# Patient Record
Sex: Male | Born: 1965 | Race: White | Hispanic: Yes | Marital: Married | Smoking: Former smoker
Health system: Southern US, Community
[De-identification: ages and names within clinical notes are randomized; demographics above are authoritative.]

## PROBLEM LIST (undated history)

## (undated) DIAGNOSIS — I10 Essential (primary) hypertension: Secondary | ICD-10-CM

## (undated) DIAGNOSIS — E7800 Pure hypercholesterolemia, unspecified: Secondary | ICD-10-CM

## (undated) HISTORY — PX: NO PAST SURGERIES: SHX2092

---

## 2009-08-15 ENCOUNTER — Ambulatory Visit: Payer: Self-pay

## 2011-04-20 IMAGING — CR DG RIBS 2V*R*
1 series · 2 of 2 positions shown · non-contrast
Comparison: none

REASON FOR EXAM: Chest Wall Trauma-FAX TO 3[REDACTED], Rickert Moemedi
COMMENTS:

RESULT:     Multiple views of the right ribs show no fracture, dislocation
or other acute bony abnormality.

[Series 1: view not recorded · 0.17mm/px · 2 of 2 slices shown]
[im 1/2]
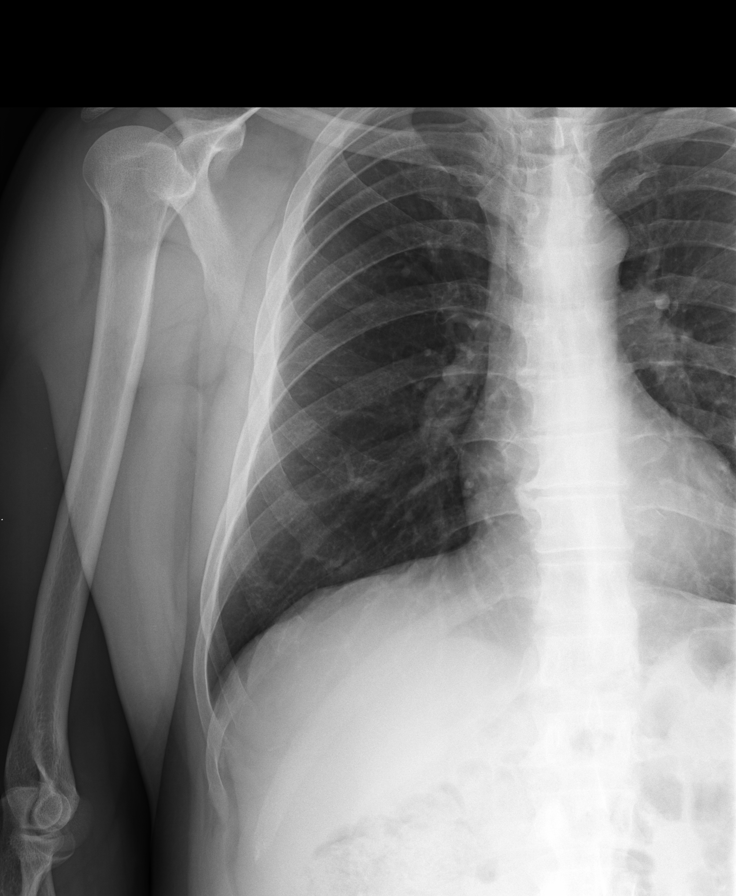
[im 2/2]
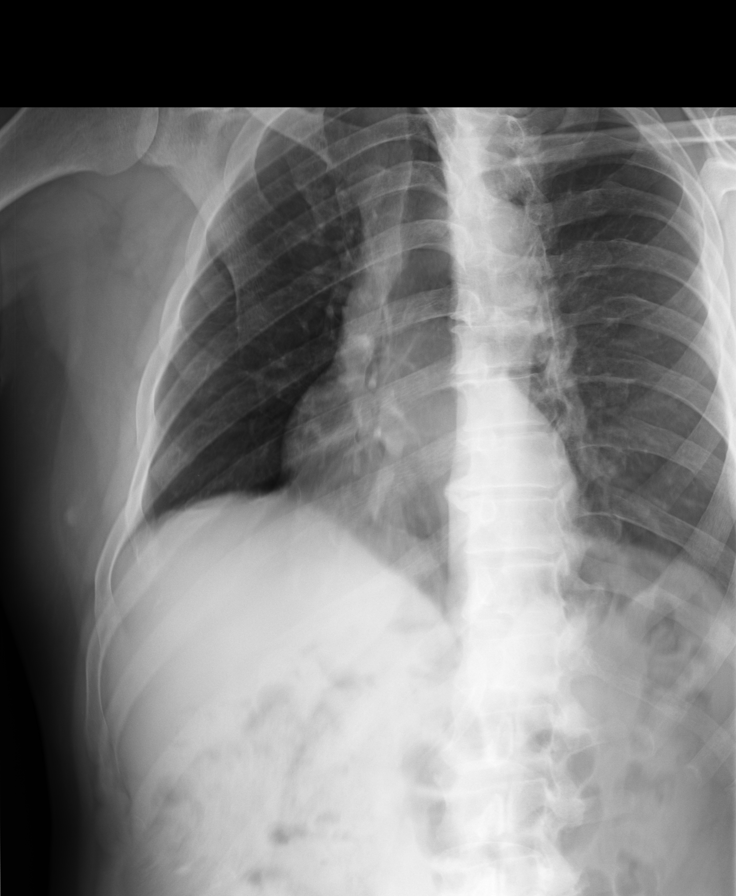

[2 of 2 positions shown; findings below may reference images not displayed]

IMPRESSION: 1. No acute bony abnormalities are identified.
2. No pneumothorax or pleural effusion is seen.

## 2017-04-27 ENCOUNTER — Telehealth: Payer: Self-pay

## 2017-04-27 ENCOUNTER — Other Ambulatory Visit: Payer: Self-pay

## 2017-04-27 DIAGNOSIS — Z1211 Encounter for screening for malignant neoplasm of colon: Secondary | ICD-10-CM

## 2017-04-27 NOTE — Telephone Encounter (Signed)
Gastroenterology Pre-Procedure Review  Request Date: 05/10/17 Requesting Physician: Dr. Allegra LaiVanga  PATIENT REVIEW QUESTIONS: The patient responded to the following health history questions as indicated:    1. Are you having any GI issues? no 2. Do you have a personal history of Polyps? no 3. Do you have a family history of Colon Cancer or Polyps? no 4. Diabetes Mellitus? no 5. Joint replacements in the past 12 months?no 6. Major health problems in the past 3 months?no 7. Any artificial heart valves, MVP, or defibrillator?no    MEDICATIONS & ALLERGIES:    Patient reports the following regarding taking any anticoagulation/antiplatelet therapy:   Plavix, Coumadin, Eliquis, Xarelto, Lovenox, Pradaxa, Brilinta, or Effient? no Aspirin? no  Patient confirms/reports the following medications:  No current outpatient medications on file.   No current facility-administered medications for this visit.     Patient confirms/reports the following allergies:  No Known Allergies  No orders of the defined types were placed in this encounter.   AUTHORIZATION INFORMATION Primary Insurance: 1D#: Group #:  Secondary Insurance: 1D#: Group #:  SCHEDULE INFORMATION: Date: 05/10/17 Time: Location:[facility]

## 2017-05-09 ENCOUNTER — Telehealth: Payer: Self-pay | Admitting: Gastroenterology

## 2017-05-09 NOTE — Telephone Encounter (Signed)
FINDA WITH THE [facility] CALLED & STATES THE [phone]#[id number] IS FOR PATIENT'S COLONOSCOPY SCHEDULED FOR 05-10-17 WITH DR Allegra LaiVANGA.

## 2017-05-10 ENCOUNTER — Ambulatory Visit
Admission: RE | Admit: 2017-05-10 | Discharge: 2017-05-10 | Disposition: A | Discharge status: Home / Self Care | Payer: Commercial Managed Care - PPO | Source: Ambulatory Visit | Attending: Gastroenterology | Admitting: Gastroenterology

## 2017-05-10 ENCOUNTER — Encounter
Admission: RE | Disposition: A | Discharge status: Home / Self Care | Payer: Self-pay | Source: Ambulatory Visit | Attending: Gastroenterology

## 2017-05-10 ENCOUNTER — Ambulatory Visit: Payer: Commercial Managed Care - PPO | Admitting: Anesthesiology

## 2017-05-10 DIAGNOSIS — E7800 Pure hypercholesterolemia, unspecified: Secondary | ICD-10-CM | POA: Diagnosis not present

## 2017-05-10 DIAGNOSIS — Z87891 Personal history of nicotine dependence: Secondary | ICD-10-CM | POA: Diagnosis not present

## 2017-05-10 DIAGNOSIS — Z1211 Encounter for screening for malignant neoplasm of colon: Secondary | ICD-10-CM | POA: Diagnosis present

## 2017-05-10 DIAGNOSIS — Z79899 Other long term (current) drug therapy: Secondary | ICD-10-CM | POA: Insufficient documentation

## 2017-05-10 DIAGNOSIS — I10 Essential (primary) hypertension: Secondary | ICD-10-CM | POA: Insufficient documentation

## 2017-05-10 DIAGNOSIS — K648 Other hemorrhoids: Secondary | ICD-10-CM | POA: Insufficient documentation

## 2017-05-10 HISTORY — DX: Pure hypercholesterolemia, unspecified: E78.00

## 2017-05-10 HISTORY — DX: Essential (primary) hypertension: I10

## 2017-05-10 HISTORY — PX: COLONOSCOPY WITH PROPOFOL: SHX5780

## 2017-05-10 SURGERY — COLONOSCOPY WITH PROPOFOL
Anesthesia: General

## 2017-05-10 MED ORDER — PROPOFOL 10 MG/ML IV BOLUS
INTRAVENOUS | Status: DC | PRN
  Administered 2017-05-10 (×3): 20 mg via INTRAVENOUS

## 2017-05-10 MED ORDER — LIDOCAINE HCL (CARDIAC) 20 MG/ML IV SOLN
INTRAVENOUS | Status: DC | PRN
  Administered 2017-05-10: 50 mg via INTRAVENOUS

## 2017-05-10 MED ORDER — SODIUM CHLORIDE 0.9 % IV SOLN
INTRAVENOUS | Status: DC
  Administered 2017-05-10 (×2): via INTRAVENOUS

## 2017-05-10 MED ORDER — PROPOFOL 500 MG/50ML IV EMUL
INTRAVENOUS | Status: AC
  Filled 2017-05-10: qty 50

## 2017-05-10 MED ORDER — PROPOFOL 500 MG/50ML IV EMUL
INTRAVENOUS | Status: DC | PRN
  Administered 2017-05-10: 150 ug/kg/min via INTRAVENOUS

## 2017-05-10 NOTE — Progress Notes (Signed)
Patient is alert and oriented. Dressed and waiting on his wife

## 2017-05-10 NOTE — Anesthesia Post-op Follow-up Note (Signed)
Anesthesia QCDR form completed.        

## 2017-05-10 NOTE — Discharge Instructions (Signed)
Colonoscopia en los adultos, cuidados posteriores  (Colonoscopy, Adult, Care After)  Aquí encontrará información sobre cómo cuidarse después del procedimiento. El médico también podrá darle instrucciones específicas. Si tiene problemas o preguntas, llame a su médico.  CUIDADOS EN EL HOGAR  Instrucciones generales  · Durante las primeras 24 horas después del procedimiento:  ? No conduzca ni opere maquinaria pesada.  ? No firme documentos importantes.  ? No beba alcohol.  ? Haga sus actividades diarias más lentamente que lo normal.  ? Coma alimentos que sean blandos y fáciles de digerir.  ? Descanse con frecuencia.  · Tome los medicamentos de venta libre o los recetados solamente como se lo haya indicado el médico.  · Depende de usted retirar los resultados del procedimiento. Pregúntele al médico o consulte en el departamento que realiza el procedimiento cuándo estarán los resultados.  Para aliviar los cólicos y el meteorismo:  · Intente caminar por la casa.  · Póngase calor en el vientre (abdomen) como se lo haya indicado el médico. Use la fuente de calor que el médico le recomiende, como una compresa de calor húmedo o una almohadilla térmica.  ? Coloque una toalla entre la piel y la fuente de calor.  ? Aplique el calor durante 20 a 30 minutos.  ? Retire la fuente de calor si la piel se le pone de color rojo brillante. Esto es muy importante si no puede sentir el dolor, el calor o el frío. Puede sufrir una quemadura.  Comida y bebida  · Beba suficiente líquido para mantener el pis (orina) claro o de color amarillo pálido.  · Reanude la dieta normal como se lo haya indicado el médico. Evite los alimentos pesados o fritos que son difíciles de digerir.  · No tome alcohol durante el tiempo que le haya indicado el médico.  SOLICITE AYUDA SI:  · Tiene sangre en la materia fecal (heces) 2 o 3 días después del procedimiento.    SOLICITE AYUDA DE INMEDIATO SI:  · Hay más que una pequeña cantidad de sangre en la materia  fecal.  · Observa grandes grumos de tejido (coágulos de sangre) en la materia fecal.  · Tiene el abdomen hinchado.  · Siente malestar estomacal (náuseas).  · Vomita.  · Tiene fiebre.  · Tiene dolor en el vientre que empeora y no se alivia con los medicamentos.    Esta información no tiene como fin reemplazar el consejo del médico. Asegúrese de hacerle al médico cualquier pregunta que tenga.  Document Released: 07/07/2010 Document Revised: 03/27/2013 Document Reviewed: 06/03/2015  Elsevier Interactive Patient Education © 2017 Elsevier Inc.

## 2017-05-10 NOTE — H&P (Signed)
  Arlyss Repressohini R Vanga, MD [address]  [address]  [place], [place] [address]  Main: [phone]  Fax: [phone] Pager: [phone]  Primary Care Physician:  [facility] Primary Gastroenterologist:  Dr. Arlyss Repressohini R Vanga  Pre-Procedure History & Physical: HPI:  Nathan Webster is a 52 y.o. male is here for an colonoscopy.   Past Medical History:  Diagnosis Date  . Hypercholesterolemia   . Hypertension     Past Surgical History:  Procedure Laterality Date  . NO PAST SURGERIES      Prior to Admission medications   Medication Sig Start Date End Date Taking? Authorizing Provider  atorvastatin (LIPITOR) 20 MG tablet Take 20 mg by mouth daily at 6 PM.   Yes [provider]    Allergies as of 04/27/2017  . (No Known Allergies)    History reviewed. No pertinent family history.  Social History   Socioeconomic History  . Marital status: Married    Spouse name: Not on file  . Number of children: Not on file  . Years of education: Not on file  . Highest education level: Not on file  Social Needs  . Financial resource strain: Not on file  . Food insecurity - worry: Not on file  . Food insecurity - inability: Not on file  . Transportation needs - medical: Not on file  . Transportation needs - non-medical: Not on file  Occupational History  . Not on file  Tobacco Use  . Smoking status: Former Smoker    Last attempt to quit: 1996    Years since quitting: 23.1  . Smokeless tobacco: Never Used  Substance and Sexual Activity  . Alcohol use: No    Frequency: Never  . Drug use: No  . Sexual activity: Not on file  Other Topics Concern  . Not on file  Social History Narrative  . Not on file    Review of Systems: See HPI, otherwise negative ROS  Physical Exam: BP (!) 132/92   Pulse (!) 59   Temp (!) 97.1 F (36.2 C) (Tympanic)   Resp 18   Ht 5\' 2"  (1.575 m)   Wt 175 lb (79.4 kg)   SpO2 100%   BMI 32.01 kg/m  General:    Alert,  pleasant and cooperative in NAD Head:  Normocephalic and atraumatic. Neck:  Supple; no masses or thyromegaly. Lungs:  Clear throughout to auscultation.    Heart:  Regular rate and rhythm. Abdomen:  Soft, nontender and nondistended. Normal bowel sounds, without guarding, and without rebound.   Neurologic:  Alert and  oriented x4;  grossly normal neurologically.  Impression/Plan: Nathan Webster is here for an colonoscopy to be performed for colon cancer screening  Risks, benefits, limitations, and alternatives regarding  colonoscopy have been reviewed with the patient.  Questions have been answered.  All parties agreeable.   Lannette Donathohini Vanga, MD  05/10/2017, 1:44 PM

## 2017-05-10 NOTE — Op Note (Signed)
[facility] Gastroenterology Patient Name: Nathan Webster Procedure Date: 05/10/2017 2:04 PM MRN: [id number] Account #: [id number] Date of Birth: [birth year] Admit Type: Outpatient Age: 52 Room: [facility] ENDO ROOM 4 Gender: Male Note Status: Finalized Procedure:            Colonoscopy Indications:          Screening for colorectal malignant neoplasm, This is                        the patient's first colonoscopy Providers:            Lin Landsman MD, MD Medicines:            Monitored Anesthesia Care Complications:        No immediate complications. Estimated blood loss: None. Procedure:            Pre-Anesthesia Assessment:                       - Prior to the procedure, a History and Physical was                        performed, and patient medications and allergies were                        reviewed. The patient is competent. The risks and                        benefits of the procedure and the sedation options and                        risks were discussed with the patient. All questions                        were answered and informed consent was obtained.                        Patient identification and proposed procedure were                        verified by the physician, the nurse, the                        anesthesiologist, the anesthetist and the technician in                        the pre-procedure area in the procedure room. Mental                        Status Examination: alert and oriented. Airway                        Examination: normal oropharyngeal airway and neck                        mobility. Respiratory Examination: clear to                        auscultation. CV Examination: normal. Prophylactic  Antibiotics: The patient does not require prophylactic                        antibiotics. Prior Anticoagulants: The patient has                        taken no previous anticoagulant or antiplatelet  agents.                        ASA Grade Assessment: II - A patient with mild systemic                        disease. After reviewing the risks and benefits, the                        patient was deemed in satisfactory condition to undergo                        the procedure. The anesthesia plan was to use monitored                        anesthesia care (MAC). Immediately prior to                        administration of medications, the patient was                        re-assessed for adequacy to receive sedatives. The                        heart rate, respiratory rate, oxygen saturations, blood                        pressure, adequacy of pulmonary ventilation, and                        response to care were monitored throughout the                        procedure. The physical status of the patient was                        re-assessed after the procedure.                       After obtaining informed consent, the colonoscope was                        passed under direct vision. Throughout the procedure,                        the patient's blood pressure, pulse, and oxygen                        saturations were monitored continuously. The                        Colonoscope was introduced through the anus and                        advanced  to the the terminal ileum. The colonoscopy was                        performed without difficulty. The patient tolerated the                        procedure well. The quality of the bowel preparation                        was evaluated using the BBPS [facility] Bowel Preparation                        Scale) with scores of: Right Colon = 3, Transverse                        Colon = 3 and Left Colon = 3 (entire mucosa seen well                        with no residual staining, small fragments of stool or                        opaque liquid). The total BBPS score equals 9. Findings:      The perianal and digital rectal examinations were  normal. Pertinent       negatives include normal sphincter tone and no palpable rectal lesions.      The terminal ileum appeared normal.      Non-bleeding internal hemorrhoids were found during retroflexion. The       hemorrhoids were large.      The entire examined colon appeared normal. Impression:           - The examined portion of the ileum was normal.                       - Non-bleeding internal hemorrhoids.                       - The entire examined colon is normal.                       - No specimens collected. Recommendation:       - Discharge patient to home.                       - Resume regular diet today.                       - Continue present medications.                       - Repeat colonoscopy in 10 years for surveillance. Procedure Code(s):    --- Professional ---                       K3491, Colorectal cancer screening; colonoscopy on                        individual not meeting criteria for high risk Diagnosis Code(s):    --- Professional ---                       Z12.11, Encounter for  screening for malignant neoplasm                        of colon                       K64.8, Other hemorrhoids CPT copyright 2016 American Medical Association. All rights reserved. The codes documented in this report are preliminary and upon coder review may  be revised to meet current compliance requirements. Dr. Ulyess Mort Lin Landsman MD, MD 05/10/2017 2:40:03 PM This report has been signed electronically. Number of Addenda: 0 Note Initiated On: 05/10/2017 2:04 PM Scope Withdrawal Time: 0 hours 8 minutes 9 seconds  Total Procedure Duration: 0 hours 10 minutes 34 seconds       [facility]

## 2017-05-10 NOTE — Transfer of Care (Signed)
Immediate Anesthesia Transfer of Care Note  Patient: Nathan Webster  Procedure(s) Performed: COLONOSCOPY WITH PROPOFOL (N/A )  Patient Location: PACU  Anesthesia Type:MAC  Level of Consciousness: awake  Airway & Oxygen Therapy: Patient Spontanous Breathing  Post-op Assessment: Report given to RN  Post vital signs: stable  Last Vitals:  Vitals:   05/10/17 1216  BP: (!) 132/92  Pulse: (!) 59  Resp: 18  Temp: (!) 36.2 C  SpO2: 100%    Last Pain:  Vitals:   05/10/17 1216  TempSrc: Tympanic         Complications: No apparent anesthesia complications

## 2017-05-10 NOTE — Anesthesia Preprocedure Evaluation (Signed)
Anesthesia Evaluation  Patient identified by MRN, date of birth, ID band Patient awake    Reviewed: Allergy & Precautions, NPO status , Patient's Chart, lab work & pertinent test results  History of Anesthesia Complications Negative for: history of anesthetic complications  Airway Mallampati: II  TM Distance: >3 FB Neck ROM: Full    Dental no notable dental hx.    Pulmonary neg sleep apnea, neg COPD, former smoker,    breath sounds clear to auscultation- rhonchi (-) wheezing      Cardiovascular Exercise Tolerance: Good hypertension, Pt. on medications (-) CAD, (-) Past MI, (-) Cardiac Stents and (-) CABG  Rhythm:Regular Rate:Normal - Systolic murmurs and - Diastolic murmurs    Neuro/Psych negative neurological ROS  negative psych ROS   GI/Hepatic negative GI ROS, Neg liver ROS,   Endo/Other  negative endocrine ROSneg diabetes  Renal/GU negative Renal ROS     Musculoskeletal negative musculoskeletal ROS (+)   Abdominal (+) + obese,   Peds  Hematology negative hematology ROS (+)   Anesthesia Other Findings Past Medical History: No date: Hypercholesterolemia No date: Hypertension   Reproductive/Obstetrics                             Anesthesia Physical Anesthesia Plan  ASA: II  Anesthesia Plan: General   Post-op Pain Management:    Induction: Intravenous  PONV Risk Score and Plan: 1 and Propofol infusion  Airway Management Planned: Natural Airway  Additional Equipment:   Intra-op Plan:   Post-operative Plan:   Informed Consent: I have reviewed the patients History and Physical, chart, labs and discussed the procedure including the risks, benefits and alternatives for the proposed anesthesia with the patient or authorized representative who has indicated his/her understanding and acceptance.   Dental advisory given  Plan Discussed with: CRNA and  Anesthesiologist  Anesthesia Plan Comments:         Anesthesia Quick Evaluation

## 2017-05-10 NOTE — OR Nursing (Addendum)
Orson SlickJacqui Laukaitis here to be our spanish interpreter.  Pt reports he takes a medication for high blood pressure and cholesterol, but does not know their names.

## 2017-05-11 ENCOUNTER — Encounter: Payer: Self-pay | Admitting: Gastroenterology

## 2017-05-11 NOTE — Anesthesia Postprocedure Evaluation (Signed)
Anesthesia Post Note  Patient: Nathan Webster  Procedure(s) Performed: COLONOSCOPY WITH PROPOFOL (N/A )  Patient location during evaluation: Endoscopy Anesthesia Type: General Level of consciousness: awake and alert and oriented Pain management: pain level controlled Vital Signs Assessment: post-procedure vital signs reviewed and stable Respiratory status: spontaneous breathing, nonlabored ventilation and respiratory function stable Cardiovascular status: blood pressure returned to baseline and stable Postop Assessment: no signs of nausea or vomiting Anesthetic complications: no     Last Vitals:  Vitals:   05/10/17 1451 05/10/17 1501  BP: (!) 129/95 (!) 135/91  Pulse: 65 (!) 55  Resp: 20 19  Temp:    SpO2: 100% 100%    Last Pain:  Vitals:   05/11/17 0820  TempSrc:   PainSc: 0-No pain                 Koji Niehoff

## 2018-05-11 DIAGNOSIS — N390 Urinary tract infection, site not specified: Secondary | ICD-10-CM | POA: Diagnosis not present

## 2018-05-11 DIAGNOSIS — R52 Pain, unspecified: Secondary | ICD-10-CM | POA: Diagnosis not present

## 2018-05-11 DIAGNOSIS — R350 Frequency of micturition: Secondary | ICD-10-CM | POA: Diagnosis not present

## 2018-10-24 ENCOUNTER — Other Ambulatory Visit: Payer: Self-pay

## 2018-10-24 DIAGNOSIS — Z20822 Contact with and (suspected) exposure to covid-19: Secondary | ICD-10-CM

## 2018-10-26 LAB — NOVEL CORONAVIRUS, NAA: SARS-CoV-2, NAA: NOT DETECTED

## 2018-10-30 NOTE — Progress Notes (Signed)
Patient came to [facility] testing site.  Negative covid results given to patient.

## 2018-11-14 ENCOUNTER — Observation Stay
Admission: EM | Admit: 2018-11-14 | Discharge: 2018-11-15 | Disposition: A | Discharge status: Home / Self Care | Payer: Commercial Managed Care - PPO | Attending: Internal Medicine | Admitting: Internal Medicine

## 2018-11-14 ENCOUNTER — Inpatient Hospital Stay: Payer: Commercial Managed Care - PPO

## 2018-11-14 ENCOUNTER — Other Ambulatory Visit: Payer: Self-pay

## 2018-11-14 ENCOUNTER — Encounter: Payer: Self-pay | Admitting: Emergency Medicine

## 2018-11-14 DIAGNOSIS — E860 Dehydration: Secondary | ICD-10-CM | POA: Insufficient documentation

## 2018-11-14 DIAGNOSIS — Z87891 Personal history of nicotine dependence: Secondary | ICD-10-CM | POA: Insufficient documentation

## 2018-11-14 DIAGNOSIS — Z79899 Other long term (current) drug therapy: Secondary | ICD-10-CM | POA: Diagnosis not present

## 2018-11-14 DIAGNOSIS — R7989 Other specified abnormal findings of blood chemistry: Secondary | ICD-10-CM | POA: Diagnosis present

## 2018-11-14 DIAGNOSIS — T675XXA Heat exhaustion, unspecified, initial encounter: Secondary | ICD-10-CM | POA: Diagnosis present

## 2018-11-14 DIAGNOSIS — R739 Hyperglycemia, unspecified: Secondary | ICD-10-CM | POA: Diagnosis not present

## 2018-11-14 DIAGNOSIS — I119 Hypertensive heart disease without heart failure: Secondary | ICD-10-CM | POA: Insufficient documentation

## 2018-11-14 DIAGNOSIS — I249 Acute ischemic heart disease, unspecified: Secondary | ICD-10-CM | POA: Diagnosis present

## 2018-11-14 DIAGNOSIS — Z1159 Encounter for screening for other viral diseases: Secondary | ICD-10-CM | POA: Insufficient documentation

## 2018-11-14 DIAGNOSIS — R079 Chest pain, unspecified: Secondary | ICD-10-CM

## 2018-11-14 DIAGNOSIS — Z7982 Long term (current) use of aspirin: Secondary | ICD-10-CM | POA: Insufficient documentation

## 2018-11-14 DIAGNOSIS — E876 Hypokalemia: Secondary | ICD-10-CM | POA: Diagnosis not present

## 2018-11-14 DIAGNOSIS — R0789 Other chest pain: Principal | ICD-10-CM | POA: Insufficient documentation

## 2018-11-14 DIAGNOSIS — E785 Hyperlipidemia, unspecified: Secondary | ICD-10-CM | POA: Insufficient documentation

## 2018-11-14 DIAGNOSIS — R778 Other specified abnormalities of plasma proteins: Secondary | ICD-10-CM

## 2018-11-14 LAB — HEMOGLOBIN A1C
Hgb A1c MFr Bld: 5.6 % (ref 4.8–5.6)
Mean Plasma Glucose: 114.02 mg/dL

## 2018-11-14 LAB — LIPID PANEL
Cholesterol: 204 mg/dL — ABNORMAL HIGH (ref 0–200)
HDL: 41 mg/dL (ref >40)
LDL Cholesterol: 125 mg/dL — ABNORMAL HIGH (ref 0–99)
Total CHOL/HDL Ratio: 5 RATIO
Triglycerides: 192 mg/dL — ABNORMAL HIGH (ref <150)
VLDL: 38 mg/dL (ref 0–40)

## 2018-11-14 LAB — BASIC METABOLIC PANEL
Anion gap: 9 (ref 5–15)
BUN: 18 mg/dL (ref 6–20)
CO2: 23 mmol/L (ref 22–32)
Calcium: 8.6 mg/dL — ABNORMAL LOW (ref 8.9–10.3)
Chloride: 102 mmol/L (ref 98–111)
Creatinine, Ser: 1.16 mg/dL (ref 0.61–1.24)
GFR calc Af Amer: >60 mL/min (ref >60)
GFR calc non Af Amer: >60 mL/min (ref >60)
Glucose, Bld: 165 mg/dL — ABNORMAL HIGH (ref 70–99)
Potassium: 3.4 mmol/L — ABNORMAL LOW (ref 3.5–5.1)
Sodium: 134 mmol/L — ABNORMAL LOW (ref 135–145)

## 2018-11-14 LAB — URINALYSIS, COMPLETE (UACMP) WITH MICROSCOPIC
Bacteria, UA: NONE SEEN
Bilirubin Urine: NEGATIVE
Glucose, UA: NEGATIVE mg/dL
Hgb urine dipstick: NEGATIVE
Ketones, ur: NEGATIVE mg/dL
Leukocytes,Ua: NEGATIVE
Nitrite: NEGATIVE
Protein, ur: NEGATIVE mg/dL
Specific Gravity, Urine: 1.008 (ref 1.005–1.030)
Squamous Epithelial / LPF: NONE SEEN (ref 0–5)
WBC, UA: NONE SEEN WBC/hpf (ref 0–5)
pH: 5 (ref 5.0–8.0)

## 2018-11-14 LAB — TROPONIN I (HIGH SENSITIVITY)
Troponin I (High Sensitivity): 76 ng/L — ABNORMAL HIGH (ref <18)
Troponin I (High Sensitivity): 93 ng/L — ABNORMAL HIGH (ref <18)

## 2018-11-14 LAB — SARS CORONAVIRUS 2 BY RT PCR (HOSPITAL ORDER, PERFORMED IN ~~LOC~~ HOSPITAL LAB): SARS Coronavirus 2: NEGATIVE

## 2018-11-14 LAB — CBC
HCT: 41.9 % (ref 39.0–52.0)
Hemoglobin: 13.7 g/dL (ref 13.0–17.0)
MCH: 29.2 pg (ref 26.0–34.0)
MCHC: 32.7 g/dL (ref 30.0–36.0)
MCV: 89.3 fL (ref 80.0–100.0)
Platelets: 221 10*3/uL (ref 150–400)
RBC: 4.69 MIL/uL (ref 4.22–5.81)
RDW: 12.6 % (ref 11.5–15.5)
WBC: 8 10*3/uL (ref 4.0–10.5)
nRBC: 0 % (ref 0.0–0.2)

## 2018-11-14 LAB — TSH: TSH: 0.978 u[IU]/mL (ref 0.350–4.500)

## 2018-11-14 LAB — CK: Total CK: 89 U/L (ref 49–397)

## 2018-11-14 MED ORDER — ATORVASTATIN CALCIUM 20 MG PO TABS
20.0000 mg | ORAL_TABLET | Freq: Every day | ORAL | Status: DC
  Administered 2018-11-14 – 2018-11-15 (×2): 20 mg via ORAL
  Filled 2018-11-14 (×2): qty 1

## 2018-11-14 MED ORDER — ASPIRIN EC 81 MG PO TBEC
81.0000 mg | DELAYED_RELEASE_TABLET | Freq: Every day | ORAL | Status: DC
  Administered 2018-11-15: 81 mg via ORAL
  Filled 2018-11-14 (×2): qty 1

## 2018-11-14 MED ORDER — ENOXAPARIN SODIUM 100 MG/ML ~~LOC~~ SOLN
1.0000 mg/kg | Freq: Two times a day (BID) | SUBCUTANEOUS | Status: DC
  Administered 2018-11-14 – 2018-11-15 (×2): 85 mg via SUBCUTANEOUS
  Filled 2018-11-14 (×3): qty 1

## 2018-11-14 MED ORDER — ASPIRIN 81 MG PO CHEW
324.0000 mg | CHEWABLE_TABLET | Freq: Once | ORAL | Status: AC
  Administered 2018-11-14: 324 mg via ORAL
  Filled 2018-11-14: qty 4

## 2018-11-14 MED ORDER — NITROGLYCERIN 0.4 MG SL SUBL: 0.4000 mg | SUBLINGUAL_TABLET | SUBLINGUAL | Status: DC | PRN

## 2018-11-14 MED ORDER — VITAMIN B-1 100 MG PO TABS
250.0000 mg | ORAL_TABLET | Freq: Every day | ORAL | Status: DC
  Administered 2018-11-14 – 2018-11-15 (×2): 250 mg via ORAL
  Filled 2018-11-14 (×2): qty 1

## 2018-11-14 MED ORDER — POTASSIUM CHLORIDE CRYS ER 20 MEQ PO TBCR
40.0000 meq | EXTENDED_RELEASE_TABLET | Freq: Once | ORAL | Status: AC
  Administered 2018-11-14: 40 meq via ORAL
  Filled 2018-11-14: qty 2

## 2018-11-14 MED ORDER — HYDROCHLOROTHIAZIDE 12.5 MG PO CAPS
12.5000 mg | ORAL_CAPSULE | Freq: Every day | ORAL | Status: DC
  Administered 2018-11-14 – 2018-11-15 (×2): 12.5 mg via ORAL
  Filled 2018-11-14 (×2): qty 1

## 2018-11-14 MED ORDER — SODIUM CHLORIDE 0.9 % IV BOLUS
1000.0000 mL | Freq: Once | INTRAVENOUS | Status: AC
  Administered 2018-11-14: 1000 mL via INTRAVENOUS

## 2018-11-14 MED ORDER — ONDANSETRON HCL 4 MG/2ML IJ SOLN: 4.0000 mg | Freq: Four times a day (QID) | INTRAMUSCULAR | Status: DC | PRN

## 2018-11-14 MED ORDER — ACETAMINOPHEN 325 MG PO TABS: 650.0000 mg | ORAL_TABLET | ORAL | Status: DC | PRN

## 2018-11-14 MED ORDER — LISINOPRIL 10 MG PO TABS
10.0000 mg | ORAL_TABLET | Freq: Every day | ORAL | Status: DC
  Administered 2018-11-14 – 2018-11-15 (×2): 10 mg via ORAL
  Filled 2018-11-14 (×3): qty 1

## 2018-11-14 MED ORDER — LISINOPRIL-HYDROCHLOROTHIAZIDE 10-12.5 MG PO TABS: 1.0000 | ORAL_TABLET | Freq: Every day | ORAL | Status: DC

## 2018-11-14 MED ORDER — VITAMIN C 500 MG PO TABS
250.0000 mg | ORAL_TABLET | Freq: Every day | ORAL | Status: DC
  Administered 2018-11-14 – 2018-11-15 (×2): 250 mg via ORAL
  Filled 2018-11-14 (×2): qty 1

## 2018-11-14 NOTE — H&P (Addendum)
[facility] [place] [facility]   PATIENT NAME: Nathan Webster    MR#:  [id number]  DATE OF BIRTH:  08-20-1965  DATE OF ADMISSION:  11/14/2018  PRIMARY CARE PHYSICIAN: [facility]   REQUESTING/REFERRING PHYSICIAN: Bayard MalesBrown, [place], MD  CHIEF COMPLAINT:   Chief Complaint  Patient presents with  . Nausea  . Generalized Body Aches  . Weakness    HISTORY OF PRESENT ILLNESS:   53 year old Spanish speaking male with past medical history of hypertension and hyperlipidemia presenting to the ED with chief complaints of chest pain, nausea and generalized body aches.  Patient report onset of symptoms since yesterday around 10:50 AM in the morning.  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 Chest pain lasted approximately two hours. He also endorses thoracic back pain with radiation into upper extremities. He was concerned about possible dehydration and increased fluid intake of which following noted increased urinary frequency. Patient state he has had chest palpitation for about a month with worsening symptoms in the last 15 days.  Per patient's daughter who is currently at the bedside, patient has had symptoms of  "impending doom" since he lost his father on July 27/2020.  Denies fevers , shortness of breath, vomiting, diarrhea, or syncope.  Patient states his coworker recently tested positive for COVID.  Due to persistent fatigue and dizziness he presented to his pcp for evaluation who sent him the ED.  On arrival to the ED, he was afebrile with blood pressure 128/8081mm Hg and pulse rate 66 beats/min. There were no focal neurological deficits; he was alert and oriented x4, and he did not demonstrate any memory deficits.  Initial blood revealed sodium 134, potassium 3.4, glucose 164 with  unremarkable CBC,Troponins elevated at 76, CK normal.  Given elevated troponin levels hospitalist was contacted to admit for further evaluation and management.  PAST MEDICAL HISTORY:   Past Medical History:  Diagnosis Date  . Hypercholesterolemia   . Hypertension     PAST SURGICAL HISTORY:   Past Surgical History:  Procedure Laterality Date  . COLONOSCOPY WITH PROPOFOL N/A 05/10/2017   Procedure: COLONOSCOPY WITH PROPOFOL;  Surgeon: Toney ReilVanga, Rohini Reddy, MD;  [facility];  Service: Gastroenterology;  Laterality: N/A;  . NO PAST SURGERIES      SOCIAL HISTORY:   Social History   Tobacco Use  . Smoking status: Former Smoker    Quit date: 1996    Years since quitting: 24.6  . Smokeless tobacco: Never Used  Substance Use Topics  . Alcohol use: No    Frequency: Never    FAMILY HISTORY:  No family history on file.  DRUG ALLERGIES:  No Known Allergies  REVIEW OF SYSTEMS:   Review of Systems  Constitutional: Positive for chills and malaise/fatigue. Negative for fever and weight loss.  HENT: Negative for congestion, hearing loss and sore throat.   Eyes: Negative for blurred vision and double vision.  Respiratory: Negative for cough, shortness of breath and wheezing.   Cardiovascular: Positive for chest pain and palpitations. Negative for orthopnea and leg swelling.  Gastrointestinal: Positive for nausea. Negative for abdominal pain, diarrhea and vomiting.  Genitourinary: Negative for dysuria and urgency.  Musculoskeletal: Positive for myalgias.  Skin: Negative for rash.  Neurological: Positive for dizziness and weakness. Negative for sensory change, speech change, focal weakness and headaches.  Psychiatric/Behavioral: Negative for  depression.    MEDICATIONS AT HOME:   Prior to Admission medications   Medication Sig Start Date End Date Taking? Authorizing Provider  atorvastatin (LIPITOR) 20 MG tablet Take 20 mg by mouth daily at 6 PM.   Yes [provider]  lisinopril-hydrochlorothiazide (ZESTORETIC) 10-12.5 MG tablet Take 1 tablet by mouth daily. for high blood pressure 10/25/18  Yes [provider]  Thiamine HCl (VITAMIN B-1) 250 MG tablet Take 250 mg by mouth daily.   Yes [provider]  vitamin C (ASCORBIC ACID) 250 MG tablet Take 250 mg by mouth daily.   Yes [provider]      VITAL SIGNS:  Blood pressure (!) 137/92, pulse 61, temperature 98.3 F (36.8 C), temperature source Oral, resp. rate 16, height 5\' 5"  (1.651 m), weight 82.6 kg, SpO2 100 %.  PHYSICAL EXAMINATION:   Physical Exam  GENERAL:  53 y.o.-year-old patient lying in the bed with no acute distress.  EYES: Pupils equal, round, reactive to light and accommodation. No scleral icterus. Extraocular muscles intact.  HEENT: Head atraumatic, normocephalic. Oropharynx and nasopharynx clear.  NECK:  Supple, no jugular venous distention. No thyroid enlargement, no tenderness.  LUNGS: Normal breath sounds bilaterally, no wheezing, rales,rhonchi or crepitation. No use of accessory muscles of respiration.  CARDIOVASCULAR: S1, S2 normal. No murmurs, rubs, or gallops.  ABDOMEN: Soft, nontender, nondistended. Bowel sounds present. No organomegaly or mass.  EXTREMITIES: No pedal edema, cyanosis, or clubbing. No rash or lesions. + pedal pulses MUSCULOSKELETAL: Normal bulk, and power was 5+ grip and elbow, knee, and ankle flexion and extension bilaterally.  NEUROLOGIC:Alert and oriented x 3. CN 2-12 intact. Sensation to light touch and cold stimuli intact bilaterally. DTR's (biceps, patellar, and achilles) 2+ and symmetric throughout. Gait not tested due to safety concern. PSYCHIATRIC: The patient is alert and oriented x 3.  SKIN: No obvious rash, lesion, or ulcer.   DATA REVIEWED:  LABORATORY PANEL:   CBC Recent Labs  Lab 11/14/18 1337  WBC 8.0  HGB 13.7  HCT 41.9  PLT 221    ------------------------------------------------------------------------------------------------------------------  Chemistries  Recent Labs  Lab 11/14/18 1337  NA 134*  K 3.4*  CL 102  CO2 23  GLUCOSE 165*  BUN 18  CREATININE 1.16  CALCIUM 8.6*   ------------------------------------------------------------------------------------------------------------------  Cardiac Enzymes No results for input(s): TROPONINI in the last 168 hours. ------------------------------------------------------------------------------------------------------------------  RADIOLOGY:  No results found.  EKG:  EKG: normal EKG, normal sinus rhythm, unchanged from previous tracings.  IMPRESSION AND PLAN:   53 y.o. male with past medical history of hypertension and hyperlipidemia presenting to the ED with chief complaints of chest pain, nausea, and generalized weakness after working in the hot sun for extended period of time.  1. Elevated troponin concerning for NSTEMI ( no evidence of dynamic EKG changes with abnormal cardiac enzymes). Heat exhaustion, anxiety in the differentials as patient recently lost father and not coping well. - Admit to telemetry unit - Chest xray - Will obtain Echocardiogram - ASA 81mg  PO daily (initial dose given in the ED) - Start  Metoprolol 12.5mg  PO bid (titrate to goal HR<70)  - NTG + morphine PRN chest pain or NTG drip if ongoing chest pain  - Anti-coagulation with Lovenox per ACS protocol - Trend troponin - Cardiology consult  2. Hypokalemia - replete and recheck in the am +mag  3. HTN  + Goal BP <130/80 - Start Metoprolol low dose as above - Continue Lisonopril-hydrochlorothiazide  4. # HLD  +  Goal LDL<100 - Check lipid panel - Atorvastatin 20mg  PO qhs  5. Elevated blood glucose - No hx of Diabetes - Check HgbA1c - SSI if indicated  6. DVT prophylaxis - Therapeutically anti-coagulated with warfarin enoxaparin    All the records are reviewed and case  discussed with ED provider. Management plans discussed with the patient, family and they are in agreement.  CODE STATUS: FULL  TOTAL TIME TAKING CARE OF THIS PATIENT: 50 minutes.    on 11/14/2018 at 6:00 PM  Webb SilversmithElizabeth Alura Olveda, DNP, FNP-BC [facility] Nurse Practitioner Between 7am to 6pm - Pager [phone]- [phone]  After 6pm go to [email or web address] - Social research officer, governmentpassword [email or web address] [email or web address]  [facility]  Office  [phone]  CC: Primary care physician; [facility]

## 2018-11-14 NOTE — Progress Notes (Signed)
Family Meeting Note  Advance Directive:yes  Today a meeting took place with the Patient and daughter.   The following clinical team members were present during this meeting:MD  The following were discussed:Patient's diagnosis: ACS, hypokalemia, Hyperlipidemia, Patient's progosis: Unable to determine and Goals for treatment: Full Code  Additional follow-up to be provided: Cardiology  Time spent during discussion:20 minutes  Vaughan Basta, MD

## 2018-11-14 NOTE — ED Provider Notes (Addendum)
[facility] Emergency Department Provider Note   First MD Initiated Contact with Patient 11/14/18 1407     (approximate)  I have reviewed the triage vital signs and the nursing notes.   HISTORY  Chief Complaint Nausea, Generalized Body Aches, and Weakness    HPI Nathan Webster is a 53 y.o. male with history of hypertension and hypercholesteremia presents to the emergency department secondary to generalized body aches nausea which patient states began yesterday and continued into today.  Patient states that he worked outside yesterday and was very hot and sweating profusely while outdoors.  Patient states that coworker of his recently tested positive for COVID-19.  Patient denies any cough no shortness of breath.        Past Medical History:  Diagnosis Date  . Hypercholesterolemia   . Hypertension     There are no active problems to display for this patient.   Past Surgical History:  Procedure Laterality Date  . COLONOSCOPY WITH PROPOFOL N/A 05/10/2017   Procedure: COLONOSCOPY WITH PROPOFOL;  Surgeon: Lin Landsman, MD;  Location: [facility] ENDOSCOPY;  Service: Gastroenterology;  Laterality: N/A;  . NO PAST SURGERIES      Prior to Admission medications   Medication Sig Start Date End Date Taking? Authorizing Provider  atorvastatin (LIPITOR) 20 MG tablet Take 20 mg by mouth daily at 6 PM.    [provider]    Allergies Patient has no known allergies.  No family history on file.  Social History Social History   Tobacco Use  . Smoking status: Former Smoker    Quit date: 1996    Years since quitting: 24.6  . Smokeless tobacco: Never Used  Substance Use Topics  . Alcohol use: No    Frequency: Never  . Drug use: No    Review of Systems Constitutional: No fever/chills Eyes: No visual changes. ENT: No sore throat. Cardiovascular: Positive for chest pain. Respiratory: Denies shortness of breath. Gastrointestinal: No  abdominal pain.  No nausea, no vomiting.  No diarrhea.  No constipation. Genitourinary: Negative for dysuria. Musculoskeletal:  Positive for generalized muscle aches Integumentary: Negative for rash. Neurological: Negative for headaches, focal weakness or numbness.  ____________________________________________   PHYSICAL EXAM:  VITAL SIGNS: ED Triage Vitals  Enc Vitals Group     BP 11/14/18 1325 128/81     Pulse Rate 11/14/18 1325 66     Resp 11/14/18 1325 16     Temp 11/14/18 1325 98.3 F (36.8 C)     Temp Source 11/14/18 1325 Oral     SpO2 11/14/18 1325 98 %     Weight 11/14/18 1326 82.6 kg (182 lb)     Height 11/14/18 1326 1.651 m (5\' 5" )     Head Circumference --      Peak Flow --      Pain Score 11/14/18 1326 6     Pain Loc --      Pain Edu? --      Excl. in [place]? --     Constitutional: Alert and oriented.  Eyes: Conjunctivae are normal.  Mouth/Throat: Mucous membranes are moist. Neck: No stridor.  No meningeal signs.   Cardiovascular: Normal rate, regular rhythm. Good peripheral circulation. Grossly normal heart sounds. Respiratory: Normal respiratory effort.  No retractions. Gastrointestinal: Soft and nontender. No distention.  Musculoskeletal: No lower extremity tenderness nor edema. No gross deformities of extremities. Neurologic:  Normal speech and language. No gross focal neurologic deficits are appreciated.  Skin:  Skin  is warm, dry and intact. Psychiatric: Mood and affect are normal. Speech and behavior are normal.  ____________________________________________   LABS (all labs ordered are listed, but only abnormal results are displayed)  Labs Reviewed  BASIC METABOLIC PANEL - Abnormal; Notable for the following components:      Result Value   Sodium 134 (*)    Potassium 3.4 (*)    Glucose, Bld 165 (*)    Calcium 8.6 (*)    All other components within normal limits  SARS CORONAVIRUS 2 (HOSPITAL ORDER, PERFORMED IN [facility])  CBC  CK   URINALYSIS, COMPLETE (UACMP) WITH MICROSCOPIC   ____________________________________________  EKG ED ECG REPORT I, [place] N Purvis Sidle, the attending physician, personally viewed and interpreted this ECG.   Date: 11/14/2018  EKG Time: 1:33 PM  Rate: 65  Rhythm: Normal sinus rhythm  Axis: Normal  Intervals: Normal  ST&T Change: None   Procedures   ____________________________________________   INITIAL IMPRESSION / MDM / ASSESSMENT AND PLAN / ED COURSE  As part of my medical decision making, I reviewed the following data within the electronic MEDICAL RECORD NUMBER   53 year old male presenting with above-stated history and physical exam concerning for possible heat exhaustion.  However did consider the possibility of ACS and as such EKG was performed which revealed no evidence of ischemia or infarction however patient's troponin elevated at 76.   Patient given 2 L IV normal saline and states that that he feels much better at this time.  In addition also considered possibly of COVID-19 and as such testing was performed which was negative.  Patient given aspirin in the emergency department secondary to elevated troponin with concern for end STEMI.  Plan to admit the patient to hospitalist.  Patient discussed with Dr. Elisabeth PigeonVachhani for hospital admission for further evaluation and management.     ____________________________________________  FINAL CLINICAL IMPRESSION(S) / ED DIAGNOSES  Final diagnoses:  Heat exhaustion, initial encounter  Elevated troponin     MEDICATIONS GIVEN DURING THIS VISIT:  Medications  sodium chloride 0.9 % bolus 1,000 mL (1,000 mLs Intravenous New Bag/Given 11/14/18 1423)  sodium chloride 0.9 % bolus 1,000 mL (1,000 mLs Intravenous New Bag/Given 11/14/18 1423)     ED Discharge Orders    None      *Please note:  Laddie AquasBernardino Cruz Segura was evaluated in Emergency Department on 11/14/2018 for the symptoms described in the history of present illness. He was  evaluated in the context of the global COVID-19 pandemic, which necessitated consideration that the patient might be at risk for infection with the SARS-CoV-2 virus that causes COVID-19. Institutional protocols and algorithms that pertain to the evaluation of patients at risk for COVID-19 are in a state of rapid change based on information released by regulatory bodies including the CDC and federal and state organizations. These policies and algorithms were followed during the patient's care in the ED.  Some ED evaluations and interventions may be delayed as a result of limited staffing during the pandemic.*  Note:  This document was prepared using Dragon voice recognition software and may include unintentional dictation errors.   Darci CurrentBrown, [place] N, MD 11/14/18 1548    Darci CurrentBrown, [place] N, MD 11/14/18 1744

## 2018-11-14 NOTE — ED Triage Notes (Signed)
Patient reports yesterday while working outside, he developed chest pain, nausea and generalized body aches. Reports he was really hot and sweating. Today patient is complaining of continued body aches and fatigue.

## 2018-11-14 NOTE — ED Notes (Signed)
ED TO INPATIENT HANDOFF REPORT  ED Nurse Name and Phone #: Clinton Sawyerkailey [phone]  S Name/Age/Gender Nathan Webster 53 y.o. male Room/Bed: ED33A/ED33A  Code Status   Code Status: Not on file  Home/SNF/Other Home Patient oriented to: self, place, time and situation Is this baseline? Yes   Triage Complete: Triage complete  Chief Complaint dehydrated;body aches;lethargy  Triage Note Patient reports yesterday while working outside, he developed chest pain, nausea and generalized body aches. Reports he was really hot and sweating. Today patient is complaining of continued body aches and fatigue.    Allergies No Known Allergies  Level of Care/Admitting Diagnosis ED Disposition    ED Disposition Condition Comment   Admit  The patient appears reasonably stabilized for admission considering the current resources, flow, and capabilities available in the ED at this time, and I doubt any other [facility] requiring further screening and/or treatment in the ED prior to admission is  present.       B Medical/Surgery History Past Medical History:  Diagnosis Date  . Hypercholesterolemia   . Hypertension    Past Surgical History:  Procedure Laterality Date  . COLONOSCOPY WITH PROPOFOL N/A 05/10/2017   Procedure: COLONOSCOPY WITH PROPOFOL;  Surgeon: Toney ReilVanga, Rohini Reddy, MD;  [facility];  Service: Gastroenterology;  Laterality: N/A;  . NO PAST SURGERIES       A IV Location/Drains/Wounds Patient Lines/Drains/Airways Status   Active Line/Drains/Airways    Name:   Placement date:   Placement time:   Site:   Days:   Peripheral IV 11/14/18 Left Arm   11/14/18    1422    Arm   less than 1          Intake/Output Last 24 hours No intake or output data in the 24 hours ending 11/14/18 1718  Labs/Imaging Results for orders placed or performed during the hospital encounter of 11/14/18 (from the past 48 hour(s))  Basic metabolic panel     Status: Abnormal   Collection Time:  11/14/18  1:37 PM  Result Value Ref Range   Sodium 134 (L) 135 - 145 mmol/L   Potassium 3.4 (L) 3.5 - 5.1 mmol/L   Chloride 102 98 - 111 mmol/L   CO2 23 22 - 32 mmol/L   Glucose, Bld 165 (H) 70 - 99 mg/dL   BUN 18 6 - 20 mg/dL   Creatinine, Ser 9.601.16 0.61 - 1.24 mg/dL   Calcium 8.6 (L) 8.9 - 10.3 mg/dL   GFR calc non Af Amer >60 >60 mL/min   GFR calc Af Amer >60 >60 mL/min   Anion gap 9 5 - 15    Comment: Performed at [facility], [address] [place], [place] [address]  CBC     Status: None   Collection Time: 11/14/18  1:37 PM  Result Value Ref Range   WBC 8.0 4.0 - 10.5 K/uL   RBC 4.69 4.22 - 5.81 MIL/uL   Hemoglobin 13.7 13.0 - 17.0 g/dL   HCT 81.141.9 91.439.0 - 78.252.0 %   MCV 89.3 80.0 - 100.0 fL   MCH 29.2 26.0 - 34.0 pg   MCHC 32.7 30.0 - 36.0 g/dL   RDW 95.612.6 21.311.5 - 08.615.5 %   Platelets 221 150 - 400 K/uL   nRBC 0.0 0.0 - 0.2 %    Comment: Performed at [facility], [address] [place], [place] [address]  CK     Status: None   Collection Time: 11/14/18  1:37 PM  Result Value Ref Range   Total CK 89 49 - 397 U/L    Comment: Performed at [facility], [place], [place] [address]  Troponin I (High Sensitivity)     Status: Abnormal   Collection Time: 11/14/18  1:37 PM  Result Value Ref Range   Troponin I (High Sensitivity) 76 (H) <18 ng/L    Comment: (NOTE) Elevated high sensitivity troponin I (hsTnI) values and significant  changes across serial measurements may suggest ACS but many other  chronic and acute conditions are known to elevate hsTnI results.  Refer to the "Links" section for chest pain algorithms and additional  guidance. Performed at [facility], [address] [place], [place] [address]   SARS Coronavirus 2 [facility] order, Performed in [facility] hospital lab) Nasopharyngeal Nasopharyngeal Swab     Status: None   Collection Time: 11/14/18  2:25 PM   Specimen: Nasopharyngeal Swab  Result Value Ref  Range   SARS Coronavirus 2 NEGATIVE NEGATIVE    Comment: (NOTE) If result is NEGATIVE SARS-CoV-2 target nucleic acids are NOT DETECTED. The SARS-CoV-2 RNA is generally detectable in upper and lower  respiratory specimens during the acute phase of infection. The lowest  concentration of SARS-CoV-2 viral copies this assay can detect is 250  copies / mL. A negative result does not preclude SARS-CoV-2 infection  and should not be used as the sole basis for treatment or other  patient management decisions.  A negative result may occur with  improper specimen collection / handling, submission of specimen other  than nasopharyngeal swab, presence of viral mutation(s) within the  areas targeted by this assay, and inadequate number of viral copies  (<250 copies / mL). A negative result must be combined with clinical  observations, patient history, and epidemiological information. If result is POSITIVE SARS-CoV-2 target nucleic acids are DETECTED. The SARS-CoV-2 RNA is generally detectable in upper and lower  respiratory specimens dur ing the acute phase of infection.  Positive  results are indicative of active infection with SARS-CoV-2.  Clinical  correlation with patient history and other diagnostic information is  necessary to determine patient infection status.  Positive results do  not rule out bacterial infection or co-infection with other viruses. If result is PRESUMPTIVE POSTIVE SARS-CoV-2 nucleic acids MAY BE PRESENT.   A presumptive positive result was obtained on the submitted specimen  and confirmed on repeat testing.  While 2019 novel coronavirus  (SARS-CoV-2) nucleic acids may be present in the submitted sample  additional confirmatory testing may be necessary for epidemiological  and / or clinical management purposes  to differentiate between  SARS-CoV-2 and other Sarbecovirus currently known to infect humans.  If clinically indicated additional testing with an alternate test   methodology [phone]) is advised. The SARS-CoV-2 RNA is generally  detectable in upper and lower respiratory sp ecimens during the acute  phase of infection. The expected result is Negative. Fact Sheet for Patients:  StrictlyIdeas.no Fact Sheet for Healthcare Providers: BankingDealers.co.za This test is not yet approved or cleared by the [place] FDA and has been authorized for detection and/or diagnosis of SARS-CoV-2 by FDA under an Emergency Use Authorization (EUA).  This EUA will remain in effect (meaning this test can be used) for the duration of the COVID-19 declaration under Section 564(b)(1) of the Act, 21 U.S.C. section 360bbb-3(b)(1), unless the authorization is terminated or revoked sooner. Performed at [facility], [address], [place], [place] [address]    No  results found.  Pending Labs Unresulted Labs (From admission, onward)    Start     Ordered   11/14/18 1327  Urinalysis, Complete w Microscopic  ONCE - STAT,   STAT     11/14/18 1327          Vitals/Pain Today's Vitals   11/14/18 1325 11/14/18 1326 11/14/18 1545  BP: 128/81  122/78  Pulse: 66  60  Resp: 16  16  Temp: 98.3 F (36.8 C)    TempSrc: Oral    SpO2: 98%  99%  Weight:  82.6 kg   Height:  5\' 5"  (1.651 m)   PainSc:  6      Isolation Precautions No active isolations  Medications Medications  sodium chloride 0.9 % bolus 1,000 mL (0 mLs Intravenous Stopped 11/14/18 1604)  sodium chloride 0.9 % bolus 1,000 mL (0 mLs Intravenous Stopped 11/14/18 1604)  aspirin chewable tablet 324 mg (324 mg Oral Given 11/14/18 1717)    Mobility walks Low fall risk   Focused Assessments Cardiac Assessment Handoff:  Cardiac Rhythm: Normal sinus rhythm Lab Results  Component Value Date   CKTOTAL 89 11/14/2018   No results found for: DDIMER Does the Patient currently have chest pain? No     R Recommendations: See Admitting Provider  Note  Report given to:   Additional Notes: speaks only spanish

## 2018-11-15 ENCOUNTER — Inpatient Hospital Stay (HOSPITAL_COMMUNITY)
Admit: 2018-11-15 | Discharge: 2018-11-15 | Disposition: A | Discharge status: Home / Self Care | Payer: Commercial Managed Care - PPO | Attending: Nurse Practitioner | Admitting: Nurse Practitioner

## 2018-11-15 ENCOUNTER — Inpatient Hospital Stay (HOSPITAL_BASED_OUTPATIENT_CLINIC_OR_DEPARTMENT_OTHER): Payer: Commercial Managed Care - PPO

## 2018-11-15 DIAGNOSIS — T675XXA Heat exhaustion, unspecified, initial encounter: Secondary | ICD-10-CM | POA: Diagnosis not present

## 2018-11-15 DIAGNOSIS — I340 Nonrheumatic mitral (valve) insufficiency: Secondary | ICD-10-CM | POA: Diagnosis not present

## 2018-11-15 DIAGNOSIS — R079 Chest pain, unspecified: Secondary | ICD-10-CM

## 2018-11-15 DIAGNOSIS — I10 Essential (primary) hypertension: Secondary | ICD-10-CM

## 2018-11-15 DIAGNOSIS — R7989 Other specified abnormal findings of blood chemistry: Secondary | ICD-10-CM

## 2018-11-15 LAB — NM MYOCAR MULTI W/SPECT W/WALL MOTION / EF
Estimated workload: 1 METS
Exercise duration (min): 0 min
Exercise duration (sec): 0 s
LV dias vol: 89 mL (ref 62–150)
LV sys vol: 29 mL
MPHR: 165 {beats}/min
Peak HR: 96 {beats}/min
Percent HR: 58 %
Rest HR: 50 {beats}/min
SDS: 0
SRS: 0
SSS: 0
TID: 0.95

## 2018-11-15 LAB — ECHOCARDIOGRAM COMPLETE
Height: 65 in
Weight: 2953.6 oz

## 2018-11-15 LAB — BASIC METABOLIC PANEL
Anion gap: 4 — ABNORMAL LOW (ref 5–15)
BUN: 15 mg/dL (ref 6–20)
CO2: 26 mmol/L (ref 22–32)
Calcium: 8.5 mg/dL — ABNORMAL LOW (ref 8.9–10.3)
Chloride: 107 mmol/L (ref 98–111)
Creatinine, Ser: 1.03 mg/dL (ref 0.61–1.24)
GFR calc Af Amer: >60 mL/min (ref >60)
GFR calc non Af Amer: >60 mL/min (ref >60)
Glucose, Bld: 108 mg/dL — ABNORMAL HIGH (ref 70–99)
Potassium: 4.4 mmol/L (ref 3.5–5.1)
Sodium: 137 mmol/L (ref 135–145)

## 2018-11-15 LAB — TROPONIN I (HIGH SENSITIVITY)
Troponin I (High Sensitivity): 85 ng/L — ABNORMAL HIGH (ref <18)
Troponin I (High Sensitivity): 76 ng/L — ABNORMAL HIGH (ref <18)

## 2018-11-15 LAB — MAGNESIUM: Magnesium: 2.3 mg/dL (ref 1.7–2.4)

## 2018-11-15 MED ORDER — POTASSIUM CHLORIDE CRYS ER 20 MEQ PO TBCR: 20.0000 meq | EXTENDED_RELEASE_TABLET | Freq: Every day | ORAL | Status: DC

## 2018-11-15 MED ORDER — TECHNETIUM TC 99M TETROFOSMIN IV KIT
30.5820 | PACK | Freq: Once | INTRAVENOUS | Status: AC | PRN
  Administered 2018-11-15: 15:00:00 30.582 via INTRAVENOUS

## 2018-11-15 MED ORDER — POTASSIUM CHLORIDE CRYS ER 20 MEQ PO TBCR: 20.0000 meq | EXTENDED_RELEASE_TABLET | Freq: Every day | ORAL | 0 refills | Status: AC

## 2018-11-15 MED ORDER — ASPIRIN 81 MG PO TBEC: 81.0000 mg | DELAYED_RELEASE_TABLET | Freq: Every day | ORAL | Status: DC

## 2018-11-15 MED ORDER — TECHNETIUM TC 99M TETROFOSMIN IV KIT
10.0000 | PACK | Freq: Once | INTRAVENOUS | Status: AC | PRN
  Administered 2018-11-15: 10.37 via INTRAVENOUS

## 2018-11-15 MED ORDER — REGADENOSON 0.4 MG/5ML IV SOLN
0.4000 mg | Freq: Once | INTRAVENOUS | Status: AC
  Administered 2018-11-15: 0.4 mg via INTRAVENOUS
  Filled 2018-11-15: qty 5

## 2018-11-15 NOTE — Progress Notes (Signed)
*  PRELIMINARY RESULTS* Echocardiogram 2D Echocardiogram has been performed.  Nathan Webster Nathan Webster 11/15/2018, 8:57 AM

## 2018-11-15 NOTE — Discharge Instructions (Signed)
Nausea and Vomiting, Adult Nausea is the feeling that you have an upset stomach or that you are about to vomit. Vomiting is when stomach contents are thrown up and out of the mouth as a result of nausea. Vomiting can make you feel weak and cause you to become dehydrated. Dehydration can make you feel tired and thirsty, cause you to have a dry mouth, and decrease how often you urinate. Older adults and people with other diseases or a weak disease-fighting system (immune system) are at higher risk for dehydration. It is important to treat your nausea and vomiting as told by your health care provider. Follow these instructions at home: Watch your symptoms for any changes. Tell your health care provider about them. Follow these instructions to care for yourself at home. Eating and drinking      Take an oral rehydration solution (ORS). This is a drink that is sold at pharmacies and retail stores.  Drink clear fluids slowly and in small amounts as you are able. Clear fluids include water, ice chips, low-calorie sports drinks, and fruit juice that has water added (diluted fruit juice).  Eat bland, easy-to-digest foods in small amounts as you are able. These foods include bananas, applesauce, rice, lean meats, toast, and crackers.  Avoid fluids that contain a lot of sugar or caffeine, such as energy drinks, sports drinks, and soda.  Avoid alcohol.  Avoid spicy or fatty foods. General instructions  Take over-the-counter and prescription medicines only as told by your health care provider.  Drink enough fluid to keep your urine pale yellow.  Wash your hands often using soap and water. If soap and water are not available, use hand sanitizer.  Make sure that all people in your household wash their hands well and often.  Rest at home while you recover.  Watch your condition for any changes.  Breathe slowly and deeply when you feel nauseated.  Keep all follow-up visits as told by your health  care provider. This is important. Contact a health care provider if:  Your symptoms get worse.  You have new symptoms.  You have a fever.  You cannot drink fluids without vomiting.  Your nausea does not go away after 2 days.  You feel light-headed or dizzy.  You have a headache.  You have muscle cramps.  You have a rash.  You have pain while urinating. Get help right away if:  You have pain in your chest, neck, arm, or jaw.  You feel extremely weak or you faint.  You have persistent vomiting.  You have vomit that is bright red or looks like black coffee grounds.  You have bloody or black stools or stools that look like tar.  You have a severe headache, a stiff neck, or both.  You have severe pain, cramping, or bloating in your abdomen.  You have difficulty breathing, or you are breathing very quickly.  Your heart is beating very quickly.  Your skin feels cold and clammy.  You feel confused.  You have signs of dehydration, such as: ? Dark urine, very little urine, or no urine. ? Cracked lips. ? Dry mouth. ? Sunken eyes. ? Sleepiness. ? Weakness. These symptoms may represent a serious problem that is an emergency. Do not wait to see if the symptoms will go away. Get medical help right away. Call your local emergency services (911 in the [place] Do not drive yourself to the hospital. Summary  Nausea is the feeling that you have an upset stomach   or that you are about to vomit. As nausea gets worse, it can lead to vomiting. Vomiting can make you feel weak and cause you to become dehydrated.  Follow instructions from your health care provider about eating and drinking to prevent dehydration.  Take over-the-counter and prescription medicines only as told by your health care provider.  Contact your health care provider if your symptoms get worse, or you have new symptoms.  Keep all follow-up visits as told by your health care provider. This is important. This  information is not intended to replace advice given to you by your health care provider. Make sure you discuss any questions you have with your health care provider. Document Released: 03/22/2005 Document Revised: 07/14/2018 Document Reviewed: 08/30/2017 Elsevier Patient Education  2020 Elsevier Inc.  

## 2018-11-15 NOTE — Discharge Summary (Signed)
[facility] - [place] at [facility]  Batu Cruz [place], [place]53 y.o., DOB 1965-07-20, MRN [id number]. Admission date: 11/14/2018 Discharge Date 11/15/2018 Primary MD [facility] Admitting Physician Jimmye NormanElizabeth Achieng Ouma, NP  Admission Diagnosis  Elevated troponin [R79.89] Chest pain with high risk of acute coronary syndrome [R07.9] Heat exhaustion, initial encounter [T67.5XXA]  Discharge Diagnosis   Active Problems: Chest pain atypical in nature Elevated troponin due to dehydration Dehydration Hypokalemia Hypertension Hyperlipidemia Elevated blood sugars with a hemoglobin A1c of 5.6     Hospital Course 53 year old Spanish speaking male with past medical history of hypertension and hyperlipidemia presenting to the ED with chief complaints of chest pain, nausea and generalized body aches.  Patient was very dehydrated.  When he came to the ER troponin was borderline elevated therefore he was admitted because he also had chest pressure.  Patient was seen by cardiology and underwent stress test.  Stress test is negative.  He is stable for discharge.  Recommended he drink plenty of fluid.          Consults  cardiology  Significant Tests:  See full reports for all details     Dg Chest 1 View  Result Date: 11/14/2018 CLINICAL DATA:  Chest pain. EXAM: CHEST  1 VIEW COMPARISON:  Rib radiographs 08/15/2009 FINDINGS: The cardiac silhouette is mildly enlarged. There is mild pulmonary vascular congestion without overt edema. No sizable pleural effusion or pneumothorax is identified. No acute osseous abnormality is seen. IMPRESSION: Cardiomegaly and mild pulmonary vascular congestion. Electronically Signed   By: Sebastian AcheAllen  Grady M.D.   On: 11/14/2018 18:36   Nm Myocar Multi W/spect W/wall Motion / Ef  Result Date: 11/15/2018 Pharmacological myocardial perfusion imaging study with no significant ischemia Normal wall motion, EF estimated at 49%,  depressed EF secondary to GI uptake artifact. (Normal EF on echo) No EKG changes concerning for ischemia at peak stress or in recovery. Low risk scan Signed, Dossie Arbourim Gollan, MD, Ph.D [facility]       Today   Subjective:   Florene [facility]ardino [facility] feeling well denies any complaints Objective:   Blood pressure 127/88, pulse (!) 57, temperature 98 F (36.7 C), temperature source Oral, resp. rate 15, height 5\' 5"  (1.651 m), weight 83.7 kg, SpO2 100 %.  .  Intake/Output Summary (Last 24 hours) at 11/15/2018 1636 Last data filed at 11/15/2018 1100 Gross per 24 hour  Intake 240 ml  Output 575 ml  Net -335 ml    Exam VITAL SIGNS: Blood pressure 127/88, pulse (!) 57, temperature 98 F (36.7 C), temperature source Oral, resp. rate 15, height 5\' 5"  (1.651 m), weight 83.7 kg, SpO2 100 %.  GENERAL:  53 y.o.-year-old patient lying in the bed with no acute distress.  EYES: Pupils equal, round, reactive to light and accommodation. No scleral icterus. Extraocular muscles intact.  HEENT: Head atraumatic, normocephalic. Oropharynx and nasopharynx clear.  NECK:  Supple, no jugular venous distention. No thyroid enlargement, no tenderness.  LUNGS: Normal breath sounds bilaterally, no wheezing, rales,rhonchi or crepitation. No use of accessory muscles of respiration.  CARDIOVASCULAR: S1, S2 normal. No murmurs, rubs, or gallops.  ABDOMEN: Soft, nontender, nondistended. Bowel sounds present. No organomegaly or mass.  EXTREMITIES: No pedal edema, cyanosis, or clubbing.  NEUROLOGIC: Cranial nerves II through XII are intact. Muscle strength 5/5 in all extremities. Sensation intact. Gait not checked.  PSYCHIATRIC: The patient is alert and oriented x 3.  SKIN: No obvious rash, lesion, or ulcer.   Data  Review     CBC w Diff:  Lab Results  Component Value Date   WBC 8.0 11/14/2018   HGB 13.7 11/14/2018   HCT 41.9 11/14/2018   PLT 221 11/14/2018   CMP:  Lab Results  Component Value Date   NA  137 11/15/2018   K 4.4 11/15/2018   CL 107 11/15/2018   CO2 26 11/15/2018   BUN 15 11/15/2018   CREATININE 1.03 11/15/2018  .  Micro Results Recent Results (from the past 240 hour(s))  SARS Coronavirus 2 [facility] order, Performed in [facility] hospital lab) Nasopharyngeal Nasopharyngeal Swab     Status: None   Collection Time: 11/14/18  2:25 PM   Specimen: Nasopharyngeal Swab  Result Value Ref Range Status   SARS Coronavirus 2 NEGATIVE NEGATIVE Final    Comment: (NOTE) If result is NEGATIVE SARS-CoV-2 target nucleic acids are NOT DETECTED. The SARS-CoV-2 RNA is generally detectable in upper and lower  respiratory specimens during the acute phase of infection. The lowest  concentration of SARS-CoV-2 viral copies this assay can detect is 250  copies / mL. A negative result does not preclude SARS-CoV-2 infection  and should not be used as the sole basis for treatment or other  patient management decisions.  A negative result may occur with  improper specimen collection / handling, submission of specimen other  than nasopharyngeal swab, presence of viral mutation(s) within the  areas targeted by this assay, and inadequate number of viral copies  (<250 copies / mL). A negative result must be combined with clinical  observations, patient history, and epidemiological information. If result is POSITIVE SARS-CoV-2 target nucleic acids are DETECTED. The SARS-CoV-2 RNA is generally detectable in upper and lower  respiratory specimens dur ing the acute phase of infection.  Positive  results are indicative of active infection with SARS-CoV-2.  Clinical  correlation with patient history and other diagnostic information is  necessary to determine patient infection status.  Positive results do  not rule out bacterial infection or co-infection with other viruses. If result is PRESUMPTIVE POSTIVE SARS-CoV-2 nucleic acids MAY BE PRESENT.   A presumptive positive result was obtained on the  submitted specimen  and confirmed on repeat testing.  While 2019 novel coronavirus  (SARS-CoV-2) nucleic acids may be present in the submitted sample  additional confirmatory testing may be necessary for epidemiological  and / or clinical management purposes  to differentiate between  SARS-CoV-2 and other Sarbecovirus currently known to infect humans.  If clinically indicated additional testing with an alternate test  methodology [phone]) is advised. The SARS-CoV-2 RNA is generally  detectable in upper and lower respiratory sp ecimens during the acute  phase of infection. The expected result is Negative. Fact Sheet for Patients:  StrictlyIdeas.no Fact Sheet for Healthcare Providers: BankingDealers.co.za This test is not yet approved or cleared by the [place] FDA and has been authorized for detection and/or diagnosis of SARS-CoV-2 by FDA under an Emergency Use Authorization (EUA).  This EUA will remain in effect (meaning this test can be used) for the duration of the COVID-19 declaration under Section 564(b)(1) of the Act, 21 U.S.C. section 360bbb-3(b)(1), unless the authorization is terminated or revoked sooner. Performed at [facility], [address], [place],  [address]         Code Status Orders  (From admission, onward)         Start     Ordered   11/14/18 1758  Full code  Continuous  11/14/18 1759        Code Status History    This patient has a current code status but no historical code status.   Advance Care Planning Activity          Follow-up Information    Center, [facility].   Specialty: General Practice Contact information: [address] [place] [address] [phone]           Discharge Medications   Allergies as of 11/15/2018   No Known Allergies     Medication List    TAKE these medications   aspirin 81 MG EC  tablet Take 1 tablet (81 mg total) by mouth daily. Start taking on: November 16, 2018   atorvastatin 20 MG tablet Commonly known as: LIPITOR Take 20 mg by mouth daily at 6 PM.   lisinopril-hydrochlorothiazide 10-12.5 MG tablet Commonly known as: ZESTORETIC Take 1 tablet by mouth daily. for high blood pressure   potassium chloride SA 20 MEQ tablet Commonly known as: K-DUR Take 1 tablet (20 mEq total) by mouth daily.   vitamin B-1 250 MG tablet Take 250 mg by mouth daily.   vitamin C 250 MG tablet Commonly known as: ASCORBIC ACID Take 250 mg by mouth daily.          Total Time in preparing paper work, data evaluation and todays exam - 35 minutes  Auburn BilberryShreyang Caytlyn Evers M.D on 11/15/2018 at 4:36 PM [facility]   Office  [phone]

## 2018-11-15 NOTE — Consult Note (Signed)
Cardiology Consultation:   Patient ID: Nathan Webster MRN: [id number]; DOB: 1965-12-24  Admit date: 11/14/2018 Date of Consult: 11/15/2018  Primary Care Provider: [facility] Primary Cardiologist:[facility], Dr. Mariah MillingGollan Primary Electrophysiologist:  None    Patient Profile:   Nathan Webster is a 53 y.o. male with a hx of hypertension, hyperlipidemia, and no other known cardiac history who is being seen today for the evaluation of chest pain at the request of Webb SilversmithElizabeth Ouma, NP.  History of Present Illness:   Nathan Webster is a 53 year old Spanish-speaking male with no significant cardiac past medical history and known history of hypertension and hyperlipidemia.  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 The chest pain lasted approximately 1 hour and was reported to radiate into his back and upper extremities.  Associated symptoms with this CP episode included fatigue, presyncope (dizziness), diaphoresis, nausea, and palpitations.  The following day (8/11), he continued to feel fatigued and noted SOB/DOE with ambulation, as well as "feeling off" while driving his truck at work. He stated, "I was hurting all over my body." [facility], which then referred him to the ED. Per daughter, who was at the patient's bedside, the patient has had anxiety since losing his father 10/30/2018 and due to COVID-19. He also has multiple family members sick with coronavirus in [place]. She reported her suspicion that her father's discomfort was due to a combination of emotional stress and dehydration after being out in the heat. Of note, patient is COVID-19 negative, but he recently had a coworker that recently tested positive. [facility], vitals were  significant for BP 128/81 and heart rate 66 bpm. EKG was negative for acute ST/T changes. Troponin minimally elevated and peaking at 93. Patient was without chest pain at the time of cardiac consultation. He reported he still felt fatigued and general myalgias, and he was tearful on discussion of his father's death. He denied further nausea.  Heart Pathway Score:     Past Medical History:  Diagnosis Date   Hypercholesterolemia    Hypertension     Past Surgical History:  Procedure Laterality Date   COLONOSCOPY WITH PROPOFOL N/A 05/10/2017   Procedure: COLONOSCOPY WITH PROPOFOL;  Surgeon: Toney ReilVanga, Rohini Reddy, MD;  [facility];  Service: Gastroenterology;  Laterality: N/A;   NO PAST SURGERIES       Home Medications:  Prior to Admission medications   Medication Sig Start Date End Date Taking? Authorizing Provider  atorvastatin (LIPITOR) 20 MG tablet Take 20 mg by mouth daily at 6 PM.   Yes [provider]  lisinopril-hydrochlorothiazide (ZESTORETIC) 10-12.5 MG tablet Take 1 tablet by mouth daily. for high blood pressure 10/25/18  Yes [provider]  Thiamine HCl (VITAMIN B-1) 250 MG tablet Take 250 mg by mouth daily.   Yes [provider]  vitamin C (ASCORBIC ACID) 250 MG tablet Take 250 mg by mouth daily.   Yes [provider]    Inpatient Medications: Scheduled Meds:  aspirin EC  81 mg Oral Daily   atorvastatin  20 mg Oral q1800   enoxaparin (LOVENOX) injection  1 mg/kg Subcutaneous Q12H   lisinopril  10 mg Oral Daily   And   hydrochlorothiazide  12.5 mg Oral Daily   vitamin  B-1  250 mg Oral Daily   vitamin C  250 mg Oral Daily   Continuous Infusions:  PRN Meds: acetaminophen, nitroGLYCERIN, ondansetron (ZOFRAN) IV  Allergies:   No Known Allergies  Social History:   Social History   Socioeconomic History   Marital status: Married    Spouse name: Not on file   Number of children: Not on file   Years of  education: Not on file   Highest education level: Not on file  Occupational History   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   Social Network [employer]   Financial resource strain: Not hard at all   Food insecurity    Worry: Never true    Inability: Never true   Transportation needs    Medical: No    Non-medical: No  Tobacco Use   Smoking status: Former Smoker    Quit date: 1996    Years since quitting: 24.6   Smokeless tobacco: Never Used  Substance and Sexual Activity   Alcohol use: No    Frequency: Never   Drug use: No   Sexual activity: Not Currently  Lifestyle   Physical activity    Days per week: Patient refused    Minutes per session: Patient refused   Stress: Not on file  Relationships   Social connections    Talks on phone: Patient refused    Gets together: Patient refused    Attends religious service: Patient refused    Active member of club or organization: Patient refused    Attends meetings of clubs or organizations: Patient refused    Relationship status: Patient refused   Intimate partner violence    Fear of current or ex partner: Patient refused    Emotionally abused: Patient refused    Physically abused: Patient refused    Forced sexual activity: Patient refused  Other Topics Concern   Not on file  Social History Narrative   Not on file    Family History:   No known family history of heart disease History reviewed. No pertinent family history.   ROS:  Please see the history of present illness.  Review of Systems  Constitutional: Positive for diaphoresis and malaise/fatigue. Negative for fever.       No further diaphoresis since 8/11  Respiratory: Positive for shortness of breath. Negative for hemoptysis.        SOB/ DOE   Cardiovascular: Positive for chest pain and palpitations. Negative for orthopnea and leg swelling.       CP not current and occurred 8/11  Gastrointestinal: Positive for nausea. Negative for blood in stool,  melena and vomiting.       No further nausea since admitted  Genitourinary: Negative for hematuria.  Musculoskeletal: Positive for back pain and myalgias. Negative for falls.       "hurts all over"  Neurological: Positive for dizziness, weakness and headaches. Negative for loss of consciousness.  All other systems reviewed and are negative.   All other ROS reviewed and negative.     Physical Exam/Data:   Vitals:   11/14/18 1759 11/14/18 2004 11/15/18 0351 11/15/18 0827  BP: (!) 137/92 (!) 142/99 113/78 118/77  Pulse: 61 (!) 52 65 (!) 57  Resp: 16 20 20 18   Temp:  98.6 F (37 C) 98.9 F (37.2 C)   TempSrc:  Oral Oral   SpO2: 100% 99% 98% 100%  Weight:  76.2 kg 83.7 kg   Height:  5\' 5"  (1.651 m)  Intake/Output Summary (Last 24 hours) at 11/15/2018 0956 Last data filed at 11/15/2018 0622 Gross per 24 hour  Intake 240 ml  Output 575 ml  Net -335 ml   Last 3 Weights 11/15/2018 11/14/2018 11/14/2018  Weight (lbs) 184 lb 9.6 oz 168 lb 182 lb  Weight (kg) 83.734 kg 76.204 kg 82.555 kg     Body mass index is 30.72 kg/m.  General:  Well nourished, well developed, in no acute distress HEENT: normal Neck: no JVD Vascular: No carotid bruits; radial pulses 2+ bilaterally Cardiac:  normal S1, S2; RRR; no murmur  Lungs:  clear to auscultation bilaterally, no wheezing, rhonchi or rales  Abd: soft, nontender, no hepatomegaly  Ext: no edema Musculoskeletal:  No deformities, BUE and BLE strength normal and equal Skin: warm and dry  Neuro:  No focal abnormalities noted Psych:  Normal affect   EKG:  The EKG was personally reviewed and demonstrates:  NSR with changes in the inferior leads with consideration of lead placement versus ischemia, nonspecific changes and poor R wave progression. Repeat EKG sinus bradycardia with up-sloping ischemic changes in lateral leads, poor conduction III, avL, V1, slight elevation in V1 / nonspecific changes including hyperacute T waves noted in anterior  leads  Telemetry:  Telemetry was personally reviewed and demonstrates: SR-SB  Relevant CV Studies: Pending echo and stress   Laboratory Data:  High Sensitivity Troponin:   Recent Labs  Lab 11/14/18 1337 11/14/18 2017 11/15/18 0816  TROPONINIHS 76* 93* 85*     Cardiac EnzymesNo results for input(s): TROPONINI in the last 168 hours. No results for input(s): TROPIPOC in the last 168 hours.  Chemistry Recent Labs  Lab 11/14/18 1337 11/15/18 0458  NA 134* 137  K 3.4* 4.4  CL 102 107  CO2 23 26  GLUCOSE 165* 108*  BUN 18 15  CREATININE 1.16 1.03  CALCIUM 8.6* 8.5*  GFRNONAA >60 >60  GFRAA >60 >60  ANIONGAP 9 4*    No results for input(s): PROT, ALBUMIN, AST, ALT, ALKPHOS, BILITOT in the last 168 hours. Hematology Recent Labs  Lab 11/14/18 1337  WBC 8.0  RBC 4.69  HGB 13.7  HCT 41.9  MCV 89.3  MCH 29.2  MCHC 32.7  RDW 12.6  PLT 221   BNPNo results for input(s): BNP, PROBNP in the last 168 hours.  DDimer No results for input(s): DDIMER in the last 168 hours.   Radiology/Studies:  Dg Chest 1 View  Result Date: 11/14/2018 CLINICAL DATA:  Chest pain. EXAM: CHEST  1 VIEW COMPARISON:  Rib radiographs 08/15/2009 FINDINGS: The cardiac silhouette is mildly enlarged. There is mild pulmonary vascular congestion without overt edema. No sizable pleural effusion or pneumothorax is identified. No acute osseous abnormality is seen. IMPRESSION: Cardiomegaly and mild pulmonary vascular congestion. Electronically Signed   By: Logan Bores M.D.   On: 11/14/2018 18:36    Assessment and Plan:   Chest pain with elevated troponin, suspect supply demand ischemia --No current chest pain. Chest pain onset with associated sx as above in HPI and in the setting of prolonged heat exposure, emotional stress. No further CP. Persistent fatigue and "feeling off."  --EKG with non-specific changes. High sensitivity Tn elevation minimal and peaking at 93, now down-trending.  --Suspect high  sensitivity Tn elevation due to supply demand etiology in the setting of heat exposure and poor oral hydration with recent emotional stress. Cannot completely rule out cardiac etiology with risk factors including h/o HTN and HLD. Hemoglobin A1C borderline at 5.6.  Further ischemic workup recommended this admission.  --After discussion of possible further ischemic workup options, patient agreeable to further ischemic workup with noninvasive stress test this admission. He remains NPO with plan for stress test later today. Echo results still pending.  --If stress test ruled low risk and echo and without acute structural changes / low EF with no further CP, will plan for medical management and aggressive risk factor control with follow-up as an outpatient. Continue ASA. Continue PTA statin and lisinopril. SL nitro as needed for CP. Addition of BB limited by current bradycardic rate and softer pressure.   HTN -- Currently controlled. Continue lisinopril (PTA lisinopril-HCTZ). Cannot add BB due bradycardic rate.   HLD --LDL 125. Continue statin therapy with recommendation for escalation in the outpatient clinic as tolerated.   For questions or updates, please contact [facility] Please consult [email or web address] for contact info under     Signed, Lennon AlstromJacquelyn D Karalyn Kadel, PA-C  11/15/2018 9:56 AM

## 2018-11-15 NOTE — Plan of Care (Signed)
  Problem: Education: Goal: Knowledge of General Education information will improve Description: Including pain rating scale, medication(s)/side effects and non-pharmacologic comfort measures Outcome: Progressing   Problem: Health Behavior/Discharge Planning: Goal: Ability to manage health-related needs will improve Outcome: Progressing Note: Patient for stress testing today. Remains in that department now. Will continue to monitor for test results. [name]

## 2018-11-16 LAB — HIV ANTIBODY (ROUTINE TESTING W REFLEX): HIV Screen 4th Generation wRfx: NONREACTIVE

## 2020-02-25 ENCOUNTER — Emergency Department
Admission: EM | Admit: 2020-02-25 | Discharge: 2020-02-25 | Disposition: A | Discharge status: Home / Self Care | Payer: Commercial Managed Care - PPO

## 2020-02-25 NOTE — ED Notes (Signed)
Daughter to registration desk states she is "signing my dad out", pt left with daughter at this time.

## 2020-07-19 IMAGING — DX CHEST  1 VIEW
1 series · 1 of 1 positions shown · non-contrast
Comparison: Rib radiographs 08/15/2009

CLINICAL DATA: Chest pain.

EXAM:
CHEST  1 VIEW

[chest ap]
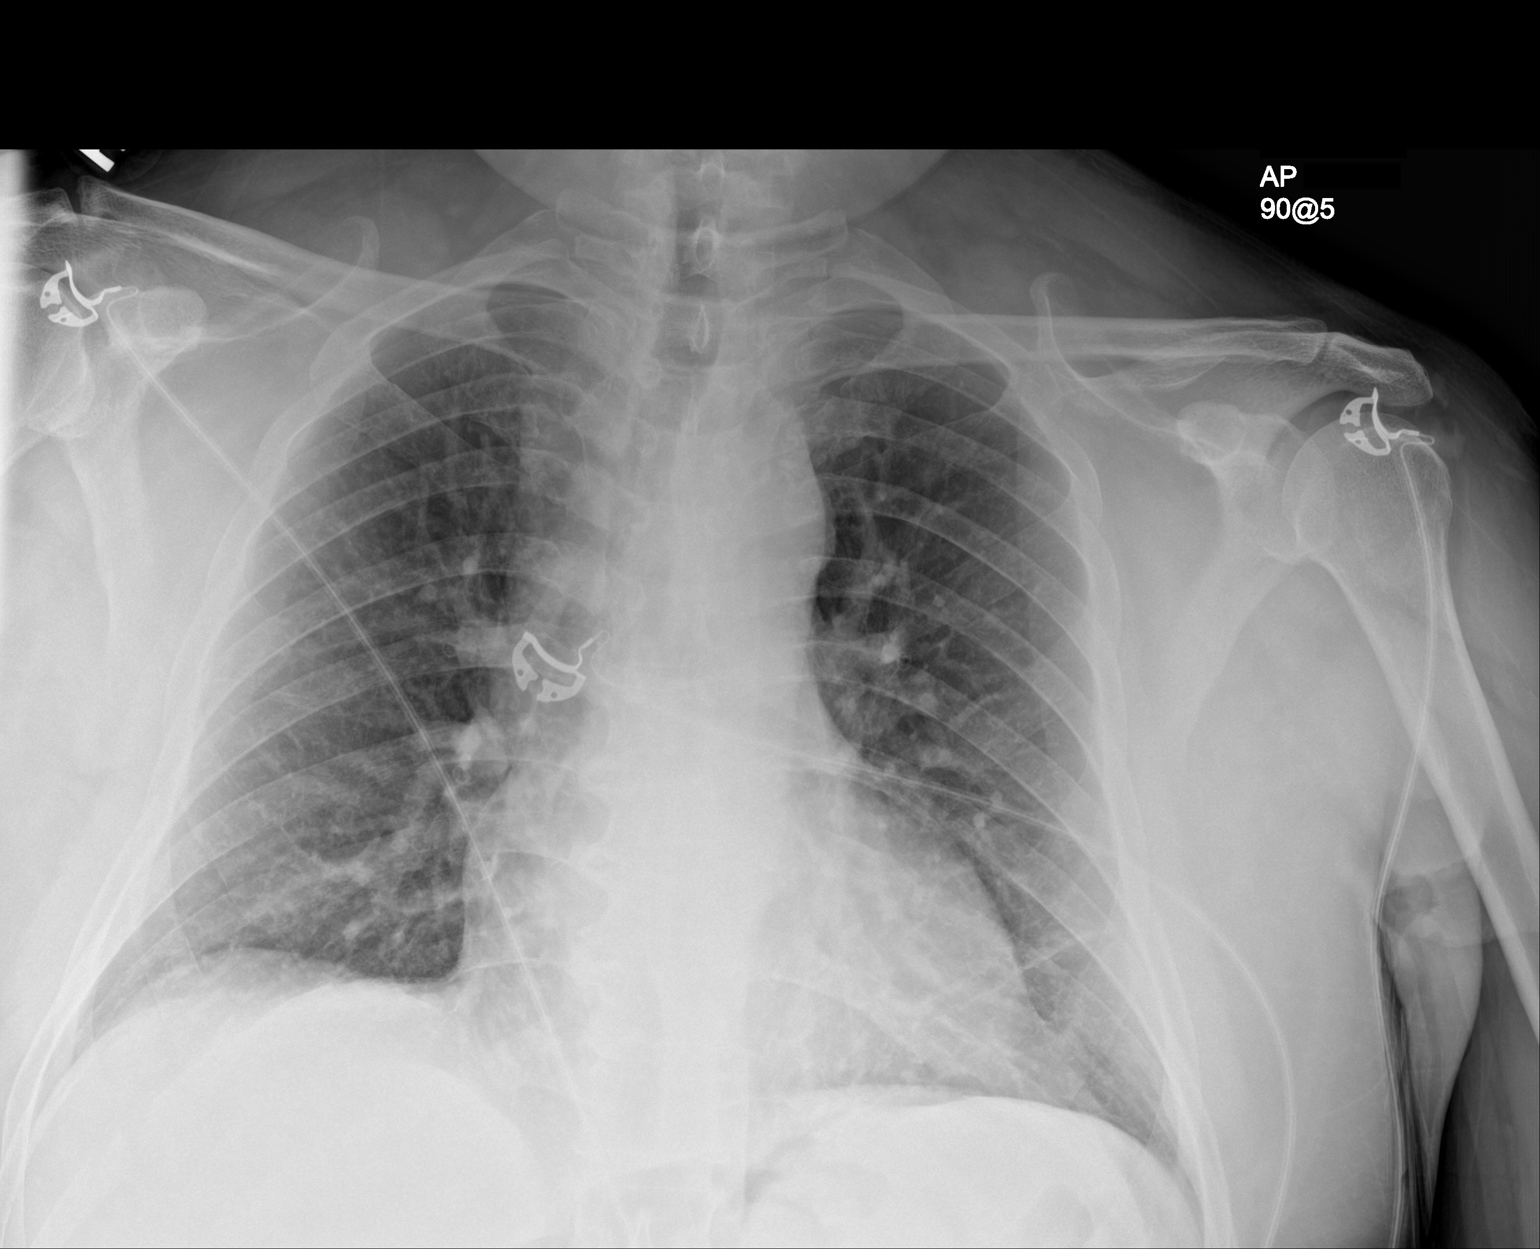

[1 of 1 positions shown; findings below may reference images not displayed]

FINDINGS: The cardiac silhouette is mildly enlarged. There is mild pulmonary
vascular congestion without overt edema. No sizable pleural effusion
or pneumothorax is identified. No acute osseous abnormality is seen.
IMPRESSION: Cardiomegaly and mild pulmonary vascular congestion.

## 2022-09-10 ENCOUNTER — Ambulatory Visit: Payer: Self-pay | Admitting: Urology

## 2022-12-02 ENCOUNTER — Ambulatory Visit (INDEPENDENT_AMBULATORY_CARE_PROVIDER_SITE_OTHER): Payer: Commercial Managed Care - PPO | Admitting: Urology

## 2022-12-02 VITALS — BP 156/94 | HR 63 | Ht 65.0 in | Wt 190.4 lb

## 2022-12-02 DIAGNOSIS — R972 Elevated prostate specific antigen [PSA]: Secondary | ICD-10-CM | POA: Diagnosis not present

## 2022-12-02 NOTE — Patient Instructions (Signed)
Prostate MRI Prep:  1- No ejaculation 48 hours prior to exam  2- No caffeine or carbonated beverages on day of the exam  3- Eat light diet evening prior and day of exam  4- Avoid eating 4 hours prior to exam  5- Fleets enema needs to be done 4 hours prior to exam -See below. Can be purchased at the drug store.

## 2022-12-02 NOTE — Progress Notes (Signed)
I, Duke Salvia, acting as a Neurosurgeon for Riki Altes, MD., have documented all relevant documentation on the behalf of Riki Altes, MD, as directed by  Riki Altes, MD while in the presence of Riki Altes, MD.  12/02/2022 4:57 PM   Nathan Webster 07, 1967 [id number]  Referring provider: Oswaldo Conroy, MD [address] [place],  [place] [address]  Chief Complaint  Patient presents with   Establish Care   Elevated PSA    HPI: Nathan Webster is a 57 y.o. male referred for evaluation of elevated PSA. [place] spanish interpreted was present during this visit.  PSA drawn by PCP in September 2023 was elevated at 9.8 and urology referral was placed, however, patient never scheduled the appointment.  PSA was repeated 07/22/22 and was higher at 10.4. No bothersome LUTS. Nocturia x1-2 and urinary frequency, however, states his urinary symptoms are not bothersome. Family history of prostate cancer in his father, although he was unsure of the age of diagnosis. His father died of COVID. Denies dysuria, gross hematuria. No history of recurrent UTI. Denies flank, abdominal, or pelvic pain.   PMH: Past Medical History:  Diagnosis Date   Hypercholesterolemia    Hypertension     Surgical History: Past Surgical History:  Procedure Laterality Date   COLONOSCOPY WITH PROPOFOL N/A 05/10/2017   Procedure: COLONOSCOPY WITH PROPOFOL;  Surgeon: Toney Reil, MD;  Location: [facility] ENDOSCOPY;  Service: Gastroenterology;  Laterality: N/A;   NO PAST SURGERIES      Home Medications:  Allergies as of 12/02/2022   No Known Allergies      Medication List        Accurate as of December 02, 2022  4:57 PM. If you have any questions, ask your nurse or doctor.          STOP taking these medications    aspirin EC 81 MG tablet Stopped by: Riki Altes   lisinopril-hydrochlorothiazide 10-12.5 MG tablet Commonly known as:  ZESTORETIC Stopped by: Riki Altes   vitamin B-1 250 MG tablet Stopped by: Riki Altes   vitamin C 250 MG tablet Commonly known as: ASCORBIC ACID Stopped by: Riki Altes       TAKE these medications    atorvastatin 20 MG tablet Commonly known as: LIPITOR Take 20 mg by mouth daily at 6 PM.   lisinopril 20 MG tablet Commonly known as: ZESTRIL Take 20 mg by mouth daily.   potassium chloride SA 20 MEQ tablet Commonly known as: KLOR-CON M Take 1 tablet (20 mEq total) by mouth daily.        Social History:  reports that he quit smoking about 28 years ago. He has never used smokeless tobacco. He reports that he does not drink alcohol and does not use drugs.   Physical Exam: BP (!) 156/94   Pulse 63   Ht 5\' 5"  (1.651 m)   Wt 190 lb 6 oz (86.4 kg)   BMI 31.68 kg/m   Constitutional:  Alert and oriented, No acute distress. HEENT: [place] AT Respiratory: Normal respiratory effort, no increased work of breathing. GU: Prostate 40 g, smooth without nodules. Psychiatric: Normal mood and affect.   Assessment & Plan:    Elevated PSA Although PSA is a prostate cancer screening test he was informed that cancer is not the most common cause of an elevated PSA. Other potential causes including BPH and inflammation were discussed. He was informed that the  only way to adequately diagnose prostate cancer would be a transrectal ultrasound and biopsy of the prostate. The procedure was discussed including potential risks of bleeding and infection/sepsis. He was also informed that a negative biopsy does not conclusively rule out the possibility that prostate cancer may be present and that continued monitoring is required. The use of newer adjunctive blood tests including PHI and 4kScore were discussed. The use of multiparametric prostate MRI to evaluate for lesions suspicious for high grade prostate cancer and aid in targeted bx was reviewed. Continued periodic surveillance was also  discussed. He has elected to schedule a prostate MRI. Order placed and we'll call with results.  I have reviewed the above documentation for accuracy and completeness, and I agree with the above.   Riki Altes, MD  [facility] [address] [place], [place] [address] [phone]

## 2022-12-04 ENCOUNTER — Encounter: Payer: Self-pay | Admitting: Urology

## 2022-12-17 ENCOUNTER — Ambulatory Visit
Admission: RE | Admit: 2022-12-17 | Discharge: 2022-12-17 | Disposition: A | Discharge status: Home / Self Care | Payer: Commercial Managed Care - PPO | Source: Ambulatory Visit | Attending: Urology | Admitting: Urology

## 2022-12-17 DIAGNOSIS — R972 Elevated prostate specific antigen [PSA]: Secondary | ICD-10-CM | POA: Diagnosis present

## 2022-12-17 MED ORDER — GADOBUTROL 1 MMOL/ML IV SOLN
8.0000 mL | Freq: Once | INTRAVENOUS | Status: AC | PRN
Start: 2022-12-17 — End: 2022-12-17
  Administered 2022-12-17: 8 mL via INTRAVENOUS

## 2023-06-20 ENCOUNTER — Other Ambulatory Visit: Payer: Self-pay | Admitting: *Deleted

## 2023-06-20 ENCOUNTER — Encounter: Payer: Self-pay | Admitting: Urology

## 2023-06-20 ENCOUNTER — Other Ambulatory Visit: Payer: Self-pay

## 2023-06-20 DIAGNOSIS — R972 Elevated prostate specific antigen [PSA]: Secondary | ICD-10-CM

## 2023-06-24 ENCOUNTER — Other Ambulatory Visit

## 2023-06-24 ENCOUNTER — Ambulatory Visit: Payer: Self-pay | Admitting: Urology

## 2023-06-24 DIAGNOSIS — R972 Elevated prostate specific antigen [PSA]: Secondary | ICD-10-CM

## 2023-06-25 LAB — PSA: Prostate Specific Ag, Serum: 11.1 ng/mL — ABNORMAL HIGH (ref 0.0–4.0)

## 2023-07-15 ENCOUNTER — Ambulatory Visit: Admitting: Urology
